# Patient Record
Sex: Male | Born: 1985 | Race: Black or African American | Hispanic: No | Smoking: Current every day smoker
Health system: Southern US, Community
[De-identification: ages and names within clinical notes are randomized; demographics above are authoritative.]

## PROBLEM LIST (undated history)

## (undated) DIAGNOSIS — K219 Gastro-esophageal reflux disease without esophagitis: Secondary | ICD-10-CM

---
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
## 2006-04-13 ENCOUNTER — Emergency Department (HOSPITAL_COMMUNITY): Admission: EM | Admit: 2006-04-13 | Discharge: 2006-04-13 | Payer: Self-pay | Admitting: Emergency Medicine

## 2006-04-20 ENCOUNTER — Emergency Department (HOSPITAL_COMMUNITY): Admission: EM | Admit: 2006-04-20 | Discharge: 2006-04-20 | Payer: Self-pay | Admitting: Emergency Medicine

## 2008-07-26 ENCOUNTER — Emergency Department (HOSPITAL_COMMUNITY): Admission: EM | Admit: 2008-07-26 | Discharge: 2008-07-26 | Payer: Self-pay | Admitting: Family Medicine

## 2009-12-19 ENCOUNTER — Inpatient Hospital Stay (HOSPITAL_COMMUNITY): Admission: EM | Admit: 2009-12-19 | Discharge: 2009-12-22 | Payer: Self-pay | Admitting: Emergency Medicine

## 2009-12-21 ENCOUNTER — Encounter: Payer: Self-pay | Admitting: Internal Medicine

## 2009-12-21 LAB — CONVERTED CEMR LAB
BUN: 9 mg/dL
CO2: 26 meq/L
Calcium: 9.5 mg/dL
Creatinine, Ser: 1.13 mg/dL
HCT: 40.3 %
Platelets: 214 10*3/uL
Potassium: 3.6 meq/L
WBC: 8.2 10*3/uL

## 2009-12-22 ENCOUNTER — Encounter: Payer: Self-pay | Admitting: Internal Medicine

## 2009-12-22 LAB — CONVERTED CEMR LAB
Calcium: 9.3 mg/dL
Chloride: 107 meq/L
Creatinine, Ser: 1.1 mg/dL
Glucose, Bld: 90 mg/dL
HCT: 41.5 %
Hemoglobin: 14 g/dL
Platelets: 228 10*3/uL
Potassium: 3.9 meq/L
RBC: 4.34 M/uL
Sodium: 139 meq/L

## 2009-12-26 ENCOUNTER — Ambulatory Visit: Payer: Self-pay | Admitting: Internal Medicine

## 2009-12-26 DIAGNOSIS — F1910 Other psychoactive substance abuse, uncomplicated: Secondary | ICD-10-CM | POA: Insufficient documentation

## 2009-12-26 DIAGNOSIS — F17200 Nicotine dependence, unspecified, uncomplicated: Secondary | ICD-10-CM

## 2009-12-26 DIAGNOSIS — F1021 Alcohol dependence, in remission: Secondary | ICD-10-CM | POA: Insufficient documentation

## 2009-12-26 DIAGNOSIS — F411 Generalized anxiety disorder: Secondary | ICD-10-CM | POA: Insufficient documentation

## 2011-01-01 LAB — CBC
HCT: 40.3 % (ref 39.0–52.0)
HCT: 41.5 % (ref 39.0–52.0)
Hemoglobin: 12.1 g/dL — ABNORMAL LOW (ref 13.0–17.0)
Hemoglobin: 13.5 g/dL (ref 13.0–17.0)
Hemoglobin: 13.9 g/dL (ref 13.0–17.0)
MCHC: 33.5 g/dL (ref 30.0–36.0)
MCHC: 34.1 g/dL (ref 30.0–36.0)
MCV: 95.7 fL (ref 78.0–100.0)
MCV: 96.5 fL (ref 78.0–100.0)
Platelets: 191 10*3/uL (ref 150–400)
Platelets: 214 10*3/uL (ref 150–400)
Platelets: 228 10*3/uL (ref 150–400)
RBC: 3.71 MIL/uL — ABNORMAL LOW (ref 4.22–5.81)
RBC: 4.34 MIL/uL (ref 4.22–5.81)
RDW: 14.1 % (ref 11.5–15.5)
RDW: 14.1 % (ref 11.5–15.5)
RDW: 14.2 % (ref 11.5–15.5)
RDW: 14.3 % (ref 11.5–15.5)
WBC: 6.3 10*3/uL (ref 4.0–10.5)

## 2011-01-01 LAB — BASIC METABOLIC PANEL
BUN: 10 mg/dL (ref 6–23)
CO2: 28 mEq/L (ref 19–32)
Creatinine, Ser: 1.1 mg/dL (ref 0.4–1.5)
GFR calc Af Amer: >60 mL/min (ref >60)
GFR calc non Af Amer: >60 mL/min (ref >60)
GFR calc non Af Amer: >60 mL/min (ref >60)
Glucose, Bld: 84 mg/dL (ref 70–99)
Sodium: 139 mEq/L (ref 135–145)

## 2011-01-01 LAB — CARDIAC PANEL(CRET KIN+CKTOT+MB+TROPI)
CK, MB: 1.7 ng/mL (ref 0.3–4.0)
Relative Index: 0.2 (ref 0.0–2.5)
Relative Index: 0.2 (ref 0.0–2.5)
Total CK: 644 U/L — ABNORMAL HIGH (ref 7–232)
Total CK: 717 U/L — ABNORMAL HIGH (ref 7–232)
Troponin I: 0.01 ng/mL (ref 0.00–0.06)
Troponin I: 0.03 ng/mL (ref 0.00–0.06)
Troponin I: <0.01 ng/mL (ref 0.00–0.06)

## 2011-01-01 LAB — RAPID URINE DRUG SCREEN, HOSP PERFORMED
Amphetamines: NOT DETECTED
Benzodiazepines: NOT DETECTED
Cocaine: NOT DETECTED
Tetrahydrocannabinol: POSITIVE — AB

## 2011-01-01 LAB — COMPREHENSIVE METABOLIC PANEL
ALT: 18 U/L (ref 0–53)
BUN: 10 mg/dL (ref 6–23)
CO2: 26 mEq/L (ref 19–32)
Chloride: 106 mEq/L (ref 96–112)
Creatinine, Ser: 1 mg/dL (ref 0.4–1.5)
Glucose, Bld: 108 mg/dL — ABNORMAL HIGH (ref 70–99)
Potassium: 3.6 mEq/L (ref 3.5–5.1)
Sodium: 139 mEq/L (ref 135–145)
Total Bilirubin: 0.8 mg/dL (ref 0.3–1.2)

## 2011-01-01 LAB — DIFFERENTIAL
Basophils Absolute: 0 10*3/uL (ref 0.0–0.1)
Basophils Relative: 1 % (ref 0–1)
Eosinophils Absolute: 0 10*3/uL (ref 0.0–0.7)
Lymphocytes Relative: 40 % (ref 12–46)
Monocytes Absolute: 0.6 10*3/uL (ref 0.1–1.0)
Neutrophils Relative %: 52 % (ref 43–77)

## 2011-01-01 LAB — POCT CARDIAC MARKERS: Troponin i, poc: <0.05 ng/mL (ref 0.00–0.09)

## 2011-01-01 LAB — POCT I-STAT, CHEM 8
BUN: 14 mg/dL (ref 6–23)
Chloride: 110 mEq/L (ref 96–112)
Hemoglobin: 15 g/dL (ref 13.0–17.0)

## 2011-01-01 LAB — LIPID PANEL
HDL: 47 mg/dL (ref >39)
LDL Cholesterol: 66 mg/dL (ref 0–99)
Triglycerides: 103 mg/dL (ref <150)

## 2011-01-01 LAB — CK TOTAL AND CKMB (NOT AT ARMC)
Relative Index: 0.2 (ref 0.0–2.5)
Total CK: 826 U/L — ABNORMAL HIGH (ref 7–232)

## 2011-01-01 LAB — TSH: TSH: 0.711 u[IU]/mL (ref 0.350–4.500)

## 2011-01-22 IMAGING — CT CT ANGIO CHEST
2 of 5 series · 19 of 36 positions shown · IV contrast (APPLIED)
Comparison: None.

CLINICAL DATA: Left chest pain, concern for pulmonary embolism

CT ANGIOGRAPHY CHEST WITH CONTRAST
TECHNIQUE: Multidetector CT imaging of the chest was performed
using the standard protocol during bolus administration of
intravenous contrast.  Multiplanar CT image reconstructions
including MIPs were obtained to evaluate the vascular anatomy.
Contrast:  80 ml Omnipaque 300

[Series 11: pulm embolism 1.0 b25f thins · axial · 0.72mm/px · z∈[-163,+78]mm · 16 of 269 slices shown]
[im 14/269  lung]
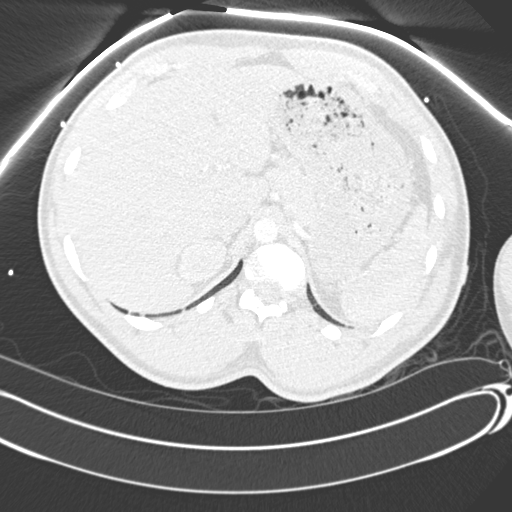
[im 27/269  mediastinal]
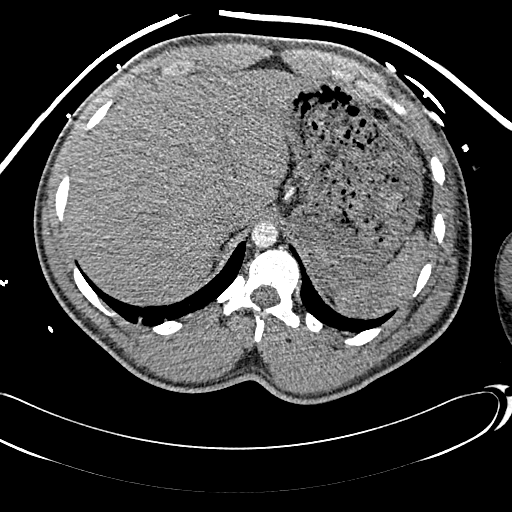
[im 41/269  lung]
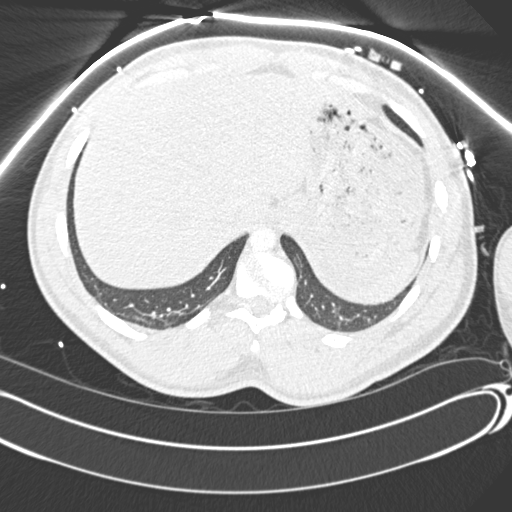
[im 68/269  mediastinal]
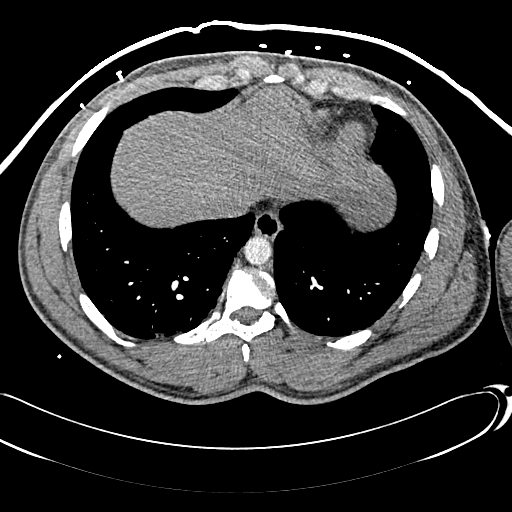
[im 81/269  lung]
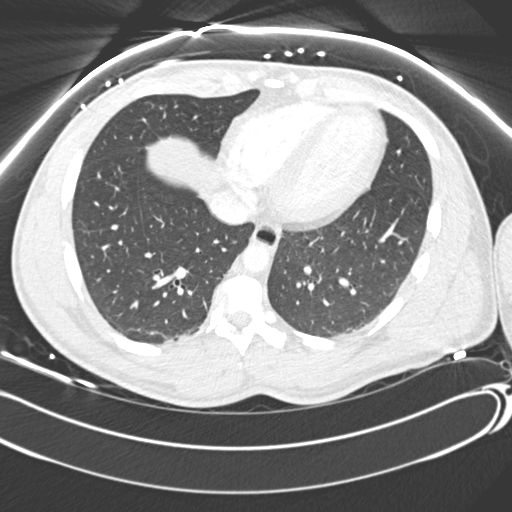
[im 94/269  mediastinal]
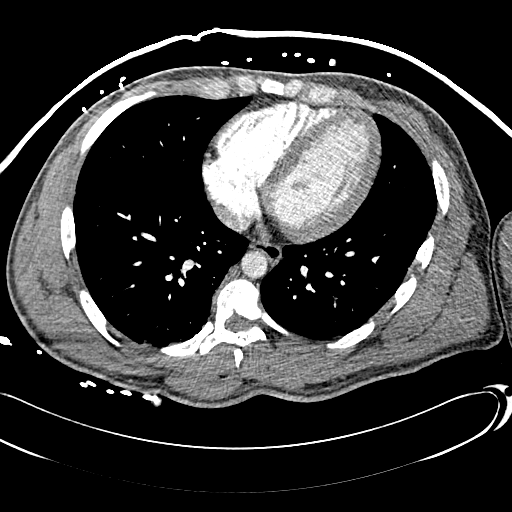
[im 108/269  lung]
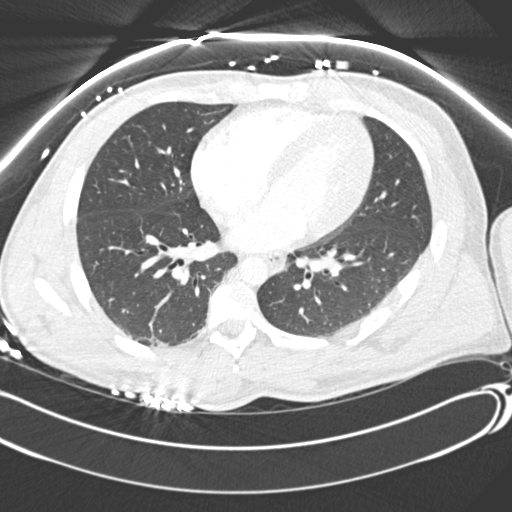
[im 121/269  mediastinal]
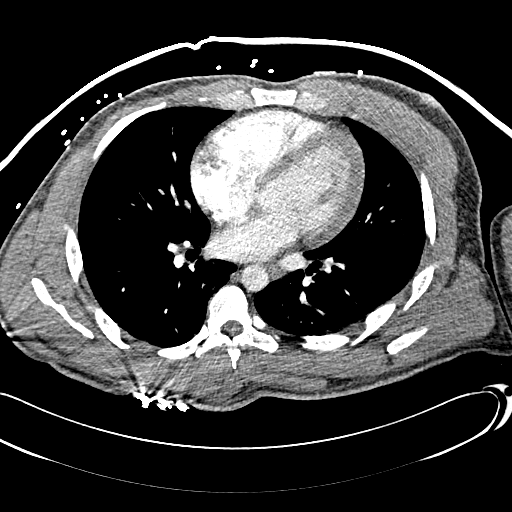
[im 148/269  lung]
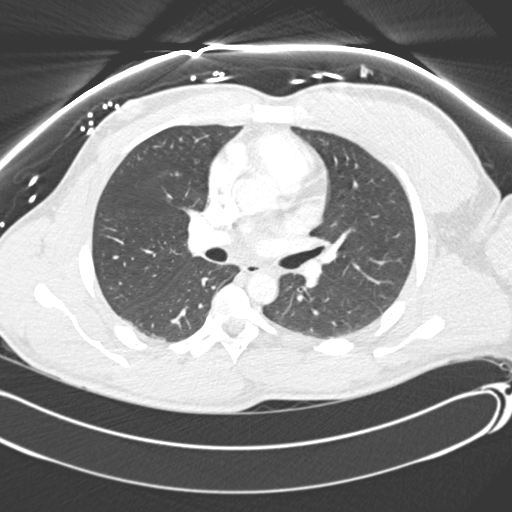
[im 161/269  mediastinal]
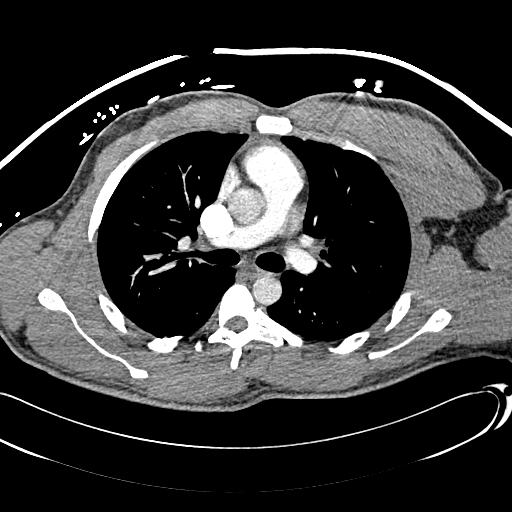
[im 175/269  lung]
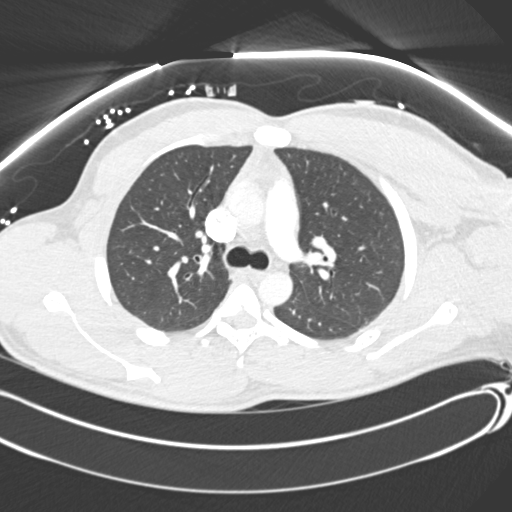
[im 188/269  mediastinal]
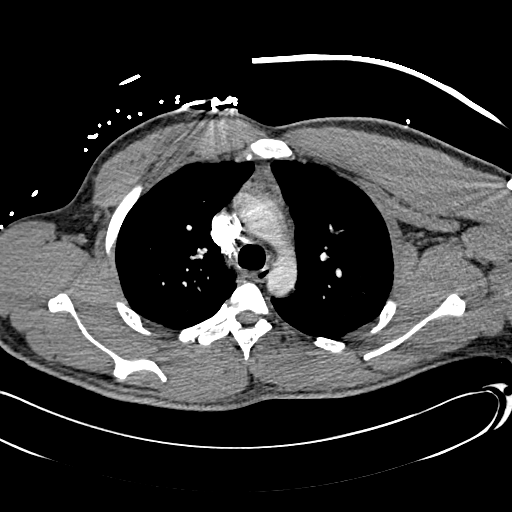
[im 202/269  lung]
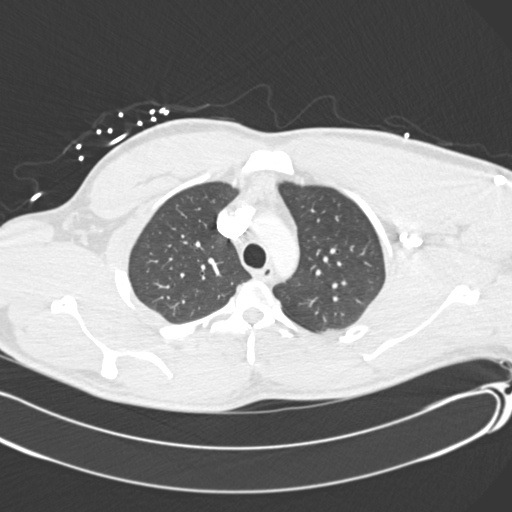
[im 228/269  mediastinal]
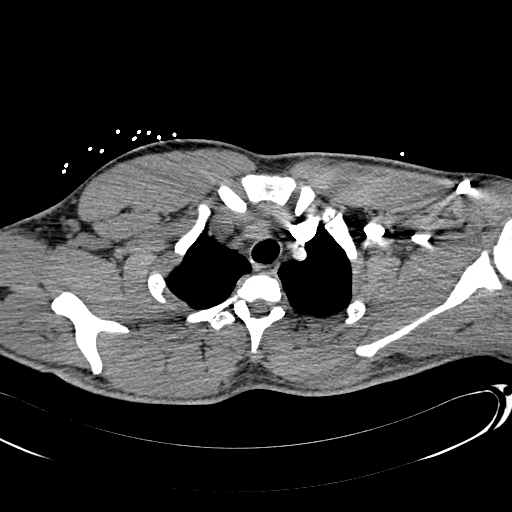
[im 242/269  lung]
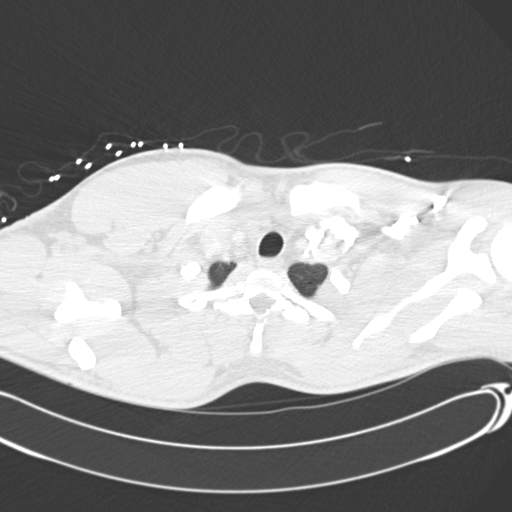
[im 255/269  mediastinal]
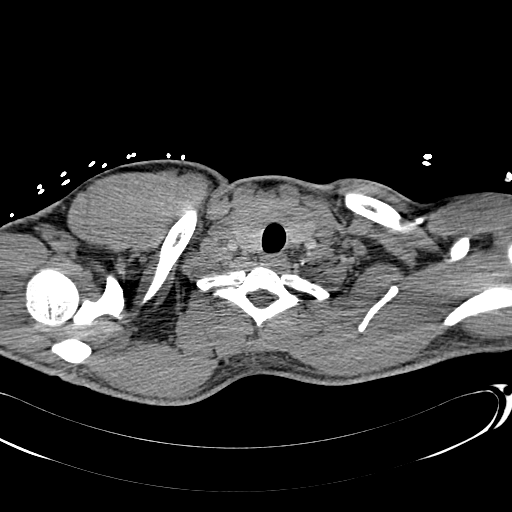

[Series 602: cor · coronal · 0.72mm/px · 3 of 105 slices shown]
[im 21/105  mediastinal]
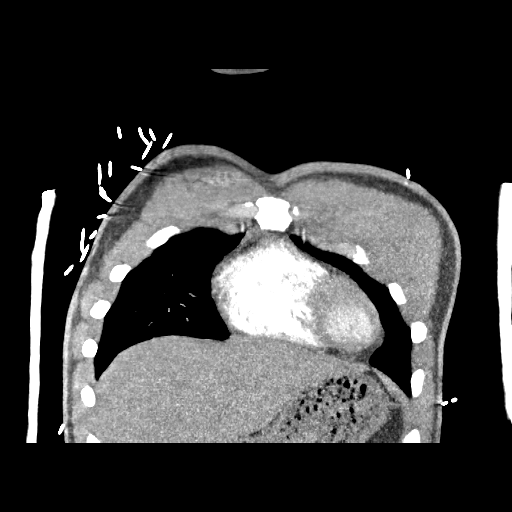
[im 42/105  mediastinal]
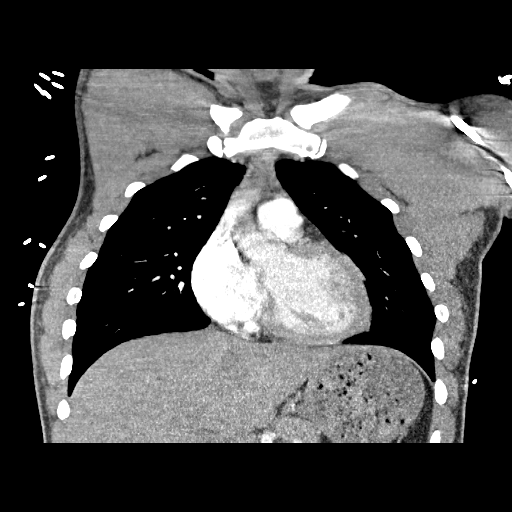
[im 63/105  mediastinal]
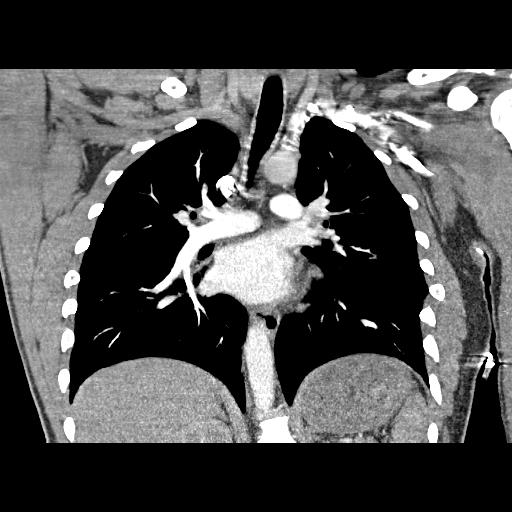

[19 of 36 positions shown; findings below may reference images not displayed]

FINDINGS: There are no filling defects within the pulmonary
arteries to suggest acute pulmonary embolism.  No acute findings of
the aorta or great vessels.  No pericardial effusion.

No chest wall abnormality.  No evidence of axillary or
supraclavicular lymphadenopathy.  No mediastinal or hilar
lymphadenopathy.

Review of the lung parenchyma demonstrates no evidence of pleural
fluid, air space disease, or pneumothorax.  Airways are normal.

Limited view of the upper abdomen is unremarkable.

Limited view of the skeleton demonstrates the sigmoid scoliosis of
the spine.

Review of the MIP images confirms the above findings.
IMPRESSION: 1.  No evidence of pulmonary embolism.
2.  No acute pulmonary parenchymal abnormality.
3.  Scoliosis

## 2011-10-26 ENCOUNTER — Encounter (HOSPITAL_COMMUNITY): Payer: Self-pay | Admitting: Emergency Medicine

## 2011-10-26 ENCOUNTER — Emergency Department (HOSPITAL_COMMUNITY)
Admission: EM | Admit: 2011-10-26 | Discharge: 2011-10-27 | Disposition: A | Discharge status: Home / Self Care | Payer: BC Managed Care – PPO | Attending: Emergency Medicine | Admitting: Emergency Medicine

## 2011-10-26 DIAGNOSIS — R51 Headache: Secondary | ICD-10-CM | POA: Insufficient documentation

## 2011-10-26 DIAGNOSIS — R1110 Vomiting, unspecified: Secondary | ICD-10-CM

## 2011-10-26 DIAGNOSIS — K219 Gastro-esophageal reflux disease without esophagitis: Secondary | ICD-10-CM | POA: Insufficient documentation

## 2011-10-26 DIAGNOSIS — R112 Nausea with vomiting, unspecified: Secondary | ICD-10-CM | POA: Insufficient documentation

## 2011-10-26 HISTORY — DX: Gastro-esophageal reflux disease without esophagitis: K21.9

## 2011-10-26 MED ORDER — ONDANSETRON 8 MG PO TBDP
8.0000 mg | ORAL_TABLET | Freq: Once | ORAL | Status: AC
  Administered 2011-10-27: 8 mg via ORAL

## 2011-10-26 NOTE — ED Notes (Signed)
Pt states this afternoon he started to have a headache that made him nauseated then he started to have vomiting  Pt states as the vomiting progressed he started noticing streaks of blood mixed in with the emesis  Pt states he has had about 4 to 5 episodes of vomiting today

## 2011-10-26 NOTE — ED Notes (Signed)
Pt states with his headache he is sensative to noise and light  Pt states he also has acid reflux which has been bothering him lately  Pt states he also has pain in his left ear

## 2011-10-27 MED ORDER — ONDANSETRON 8 MG PO TBDP: 8.0000 mg | ORAL_TABLET | Freq: Once | ORAL | Status: AC

## 2011-10-27 MED ORDER — PANTOPRAZOLE SODIUM 20 MG PO TBEC: 20.0000 mg | DELAYED_RELEASE_TABLET | Freq: Every day | ORAL | Status: AC

## 2011-10-27 NOTE — ED Provider Notes (Signed)
History     CSN: [id number]  Arrival date & time 10/26/11  2229   First MD Initiated Contact with Patient 10/26/11 2332      Chief Complaint  Patient presents with  . Hematemesis    (Consider location/radiation/quality/duration/timing/severity/associated sxs/prior treatment) HPI Comments: Patient here with a day history of headache with sensitivity to noise and light - no history of migraines - reports nausea with several episodes of vomiting today - the last couple with blood streaks - states that his wife then insisted he come in.  Patient is a 26 y.o. male presenting with headaches. The history is provided by the patient. No language interpreter was used.  Headache  This is a new problem. The current episode started 12 to 24 hours ago. The problem occurs constantly. The problem has been gradually improving. The headache is associated with bright light. The pain is located in the bilateral and frontal region. The quality of the pain is described as throbbing. The pain is at a severity of 3/10. The pain is mild. The pain does not radiate. Associated symptoms include nausea and vomiting. Pertinent negatives include no anorexia, no fever, no malaise/fatigue, no chest pressure, no near-syncope, no orthopnea, no palpitations, no syncope and no shortness of breath. He has tried nothing for the symptoms. The treatment provided no relief.    Past Medical History  Diagnosis Date  . Acid reflux     History reviewed. No pertinent past surgical history.  Family History  Problem Relation Age of Onset  . Diabetes Mother   . Hypertension Mother   . Hypertension Sister   . Diabetes Brother     History  Substance Use Topics  . Smoking status: Current Everyday Smoker -- 0.5 packs/day    Types: Cigarettes  . Smokeless tobacco: Not on file  . Alcohol Use: Yes     occassional      Review of Systems  Constitutional: Negative for fever and malaise/fatigue.  Respiratory: Negative for  shortness of breath.   Cardiovascular: Negative for palpitations, orthopnea, syncope and near-syncope.  Gastrointestinal: Positive for nausea and vomiting. Negative for anorexia.  Neurological: Positive for headaches.  All other systems reviewed and are negative.    Allergies  Ibuprofen  Home Medications   Current Outpatient Rx  Name Route Sig Dispense Refill  . ONDANSETRON 8 MG PO TBDP Oral Take 1 tablet (8 mg total) by mouth once. 20 tablet 0  . PANTOPRAZOLE SODIUM 20 MG PO TBEC Oral Take 1 tablet (20 mg total) by mouth daily. 30 tablet 0    BP 130/91  Pulse 77  Temp(Src) 98.3 F (36.8 C) (Oral)  Resp 18  SpO2 100%  Physical Exam  Nursing note and vitals reviewed. Constitutional: He is oriented to person, place, and time. He appears well-developed and well-nourished. No distress.  HENT:  Head: Normocephalic and atraumatic.  Right Ear: External ear normal.  Left Ear: External ear normal.  Mouth/Throat: Oropharynx is clear and moist. No oropharyngeal exudate.  Eyes: Conjunctivae are normal. Pupils are equal, round, and reactive to light. No scleral icterus.  Neck: Normal range of motion. Neck supple.  Cardiovascular: Normal rate, regular rhythm and normal heart sounds.  Exam reveals no gallop and no friction rub.   No murmur heard. Pulmonary/Chest: Effort normal and breath sounds normal. He exhibits no tenderness.  Abdominal: Soft. Bowel sounds are normal. There is no tenderness.  Musculoskeletal: Normal range of motion.  Lymphadenopathy:    He has no cervical adenopathy.  Neurological: He is alert and oriented to person, place, and time. No cranial nerve deficit.  Skin: Skin is warm and dry.  Psychiatric: He has a normal mood and affect. His behavior is normal. Judgment and thought content normal.    ED Course  Procedures (including critical care time)  Labs Reviewed - No data to display No results found.   1. Vomiting       MDM  Patient does not wish to  have IV fluids or any interventions - states that he now feels better - he would like medication for the nausea - given zofran OTD - patient without any concerning features on physical examiantion - has history of GERD which would likely explain the blood streaks in the vomit - given rx for protonix as well per his request.        Scarlette Calico C. [place], [place] 10/27/11 [phone]

## 2011-10-27 NOTE — ED Notes (Signed)
Pt states that he feels better and is leaving. Explained possible problems with his condition. Pt still states wants to leave.

## 2011-10-27 NOTE — ED Provider Notes (Signed)
Medical screening examination/treatment/procedure(s) were performed by non-physician practitioner and as supervising physician I was immediately available for consultation/collaboration.   Hanley Seamen, MD 10/27/11 [phone]

## 2011-10-27 NOTE — ED Notes (Signed)
PA spoke with pt. Pt will stay for medicationa and then discharge

## 2012-03-06 ENCOUNTER — Ambulatory Visit (INDEPENDENT_AMBULATORY_CARE_PROVIDER_SITE_OTHER): Payer: BC Managed Care – PPO

## 2012-03-07 ENCOUNTER — Ambulatory Visit (INDEPENDENT_AMBULATORY_CARE_PROVIDER_SITE_OTHER): Payer: BC Managed Care – PPO | Admitting: Family Medicine

## 2012-03-07 VITALS — BP 149/103 | HR 79 | Temp 98.6°F | Resp 18 | Ht 67.5 in | Wt 187.0 lb

## 2012-03-07 DIAGNOSIS — N390 Urinary tract infection, site not specified: Secondary | ICD-10-CM

## 2012-03-07 DIAGNOSIS — F1020 Alcohol dependence, uncomplicated: Secondary | ICD-10-CM

## 2012-03-07 LAB — COMPREHENSIVE METABOLIC PANEL
ALT: 27 U/L (ref 0–53)
AST: 22 U/L (ref 0–37)
Calcium: 10.3 mg/dL (ref 8.4–10.5)
Chloride: 106 mEq/L (ref 96–112)
Creat: 1.01 mg/dL (ref 0.50–1.35)
Potassium: 4.2 mEq/L (ref 3.5–5.3)
Sodium: 139 mEq/L (ref 135–145)

## 2012-03-07 LAB — POCT CBC
Granulocyte percent: 65.5 %G (ref 37–80)
MCV: 93.8 fL (ref 80–97)
MID (cbc): 0.4 (ref 0–0.9)
MPV: 8.8 fL (ref 0–99.8)
Platelet Count, POC: 316 10*3/uL (ref 142–424)
RBC: 4.99 M/uL (ref 4.69–6.13)

## 2012-03-07 LAB — POCT URINALYSIS DIPSTICK
Glucose, UA: NEGATIVE
Leukocytes, UA: NEGATIVE
Nitrite, UA: NEGATIVE
Urobilinogen, UA: 0.2

## 2012-03-07 LAB — POCT UA - MICROSCOPIC ONLY: Crystals, Ur, HPF, POC: NEGATIVE

## 2012-03-07 MED ORDER — DOXYCYCLINE HYCLATE 100 MG PO TABS: 100.0000 mg | ORAL_TABLET | Freq: Two times a day (BID) | ORAL | Status: AC

## 2012-03-07 NOTE — Progress Notes (Signed)
Subjective:    Patient ID: Andre Wood, male    DOB: Jul 19, 1986, 26 y.o.   MRN: [id number]  HPI  Patient presents complaining of pain at the end of his urine stream X 6 weeks.  No penile discharge or penile lesion Last night (L) testicular tenderness Urinary hesitancy; incomplete emptying; dribbling  Married; monogomous  Etoh- 2/5 of liquor on the weekend [facility]- one once weekly   Review of Systems     Objective:   Physical Exam  Constitutional: He appears well-developed.  HENT:  Head: Normocephalic and atraumatic.  Mouth/Throat: Oropharynx is clear and moist.  Eyes: EOM are normal.  Neck: Neck supple. No thyromegaly present.  Cardiovascular: Normal rate, regular rhythm and normal heart sounds.   Pulmonary/Chest: Effort normal and breath sounds normal.  Abdominal: Soft. Bowel sounds are normal. There is no tenderness.  Genitourinary: Testes normal and penis normal. Right testis shows no tenderness. Left testis shows no tenderness. No penile erythema. No discharge found.       Declined prostate examination  Neurological: He is alert.       Results for orders placed in visit on 03/07/12  POCT URINALYSIS DIPSTICK      Component Value Range   Color, UA YELLOW     Clarity, UA CLEAR     Glucose, UA NEG     Bilirubin, UA SMALL     Ketones, UA 15     Spec Grav, UA >=1.030     Blood, UA NEG     pH, UA 5.5     Protein, UA TRACE     Urobilinogen, UA 0.2     Nitrite, UA NEG     Leukocytes, UA Negative    POCT UA - MICROSCOPIC ONLY      Component Value Range   WBC, Ur, HPF, POC 0-1     RBC, urine, microscopic 0-1     Bacteria, U Microscopic TRACE     Mucus, UA POSITIVE     Epithelial cells, urine per micros NEG     Crystals, Ur, HPF, POC NEG     Casts, Ur, LPF, POC NEG     Yeast, UA NEG    POCT CBC      Component Value Range   WBC 6.8  4.6 - 10.2 (K/uL)   Lymph, poc 2.0  0.6 - 3.4    POC LYMPH PERCENT 28.9  10 - 50 (%L)   MID (cbc) 0.4  0 - 0.9    POC MID %  5.6  0 - 12 (%M)   POC Granulocyte 4.5  2 - 6.9    Granulocyte percent 65.5  37 - 80 (%G)   RBC 4.99  4.69 - 6.13 (M/uL)   Hemoglobin 14.9  14.1 - 18.1 (g/dL)   HCT, POC 40.9  81.1 - 53.7 (%)   MCV 93.8  80 - 97 (fL)   MCH, POC 29.9  27 - 31.2 (pg)   MCHC 31.8  31.8 - 35.4 (g/dL)   RDW, POC 91.4     Platelet Count, POC 316  142 - 424 (K/uL)   MPV 8.8  0 - 99.8 (fL)       Assessment & Plan:   1. Dysuria; suspect prostatitis given clinical presentation. POCT urinalysis dipstick, POCT UA - Microscopic Only, GC/chlamydia probe amp, urine, doxycycline (VIBRA-TABS) 100 MG tablet  2. 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

## 2012-03-08 LAB — GC/CHLAMYDIA PROBE AMP, URINE
Chlamydia, Swab/Urine, PCR: NEGATIVE
GC Probe Amp, Urine: NEGATIVE

## 2012-03-10 ENCOUNTER — Encounter: Payer: Self-pay | Admitting: *Deleted

## 2012-05-30 ENCOUNTER — Other Ambulatory Visit (HOSPITAL_COMMUNITY): Payer: Self-pay | Admitting: Chiropractic Medicine

## 2012-05-30 DIAGNOSIS — IMO0002 Reserved for concepts with insufficient information to code with codable children: Secondary | ICD-10-CM

## 2012-06-02 ENCOUNTER — Inpatient Hospital Stay (HOSPITAL_COMMUNITY)
Admission: RE | Admit: 2012-06-02 | Discharge: 2012-06-02 | Payer: BC Managed Care – PPO | Source: Ambulatory Visit | Attending: Chiropractic Medicine | Admitting: Chiropractic Medicine

## 2015-08-31 ENCOUNTER — Emergency Department (HOSPITAL_COMMUNITY)
Admission: EM | Admit: 2015-08-31 | Discharge: 2015-08-31 | Disposition: A | Discharge status: Home / Self Care | Payer: Medicaid Other | Attending: Emergency Medicine | Admitting: Emergency Medicine

## 2015-08-31 DIAGNOSIS — K219 Gastro-esophageal reflux disease without esophagitis: Secondary | ICD-10-CM | POA: Diagnosis not present

## 2015-08-31 DIAGNOSIS — F17210 Nicotine dependence, cigarettes, uncomplicated: Secondary | ICD-10-CM | POA: Diagnosis not present

## 2015-08-31 DIAGNOSIS — Z79899 Other long term (current) drug therapy: Secondary | ICD-10-CM | POA: Insufficient documentation

## 2015-08-31 DIAGNOSIS — R369 Urethral discharge, unspecified: Secondary | ICD-10-CM | POA: Diagnosis present

## 2015-08-31 MED ORDER — CEFTRIAXONE SODIUM 250 MG IJ SOLR
250.0000 mg | Freq: Once | INTRAMUSCULAR | Status: AC
  Administered 2015-08-31: 250 mg via INTRAMUSCULAR
  Filled 2015-08-31: qty 250

## 2015-08-31 MED ORDER — LIDOCAINE HCL (PF) 1 % IJ SOLN
INTRAMUSCULAR | Status: AC
  Filled 2015-08-31: qty 5

## 2015-08-31 MED ORDER — AZITHROMYCIN 250 MG PO TABS
1000.0000 mg | ORAL_TABLET | Freq: Once | ORAL | Status: AC
  Administered 2015-08-31: 1000 mg via ORAL
  Filled 2015-08-31: qty 4

## 2015-08-31 NOTE — ED Provider Notes (Signed)
CSN: [id number]     Arrival date & time 08/31/15  1716 History  By signing my name below, I, Andre Wood, attest that this documentation has been prepared under the direction and in the presence of Vesta Wheeland, PA-C. Electronically Signed: Lyndel Wood, ED Scribe. 08/31/2015. 5:37 PM.   Chief Complaint  Patient presents with  . Penile Discharge   The history is provided by the patient. No language interpreter was used.   HPI Comments: Breslin Hemann is a 29 y.o. male, with no pertinent PMHx, who presents to the Emergency Department complaining of sudden onset white discharge from his penis that he noticed this morning s/p possible STD exposure last night. Pt reports while having sexual intercourse last night the condom broke and he is concerned for STD exposure. Pt associates mild tenderness to distal aspect of penis over the glans. He denies abdominal pain, nausea, vomiting, painful bowel movements, dysuria or any other urinary symptoms, tenderness or swelling to testicles, and inguinal LAD. Pt afebrile.   Past Medical History  Diagnosis Date  . Acid reflux    No past surgical history on file. Family History  Problem Relation Age of Onset  . Diabetes Mother   . Hypertension Mother   . Hypertension Sister   . Diabetes Brother    Social History  Substance Use Topics  . Smoking status: Current Every Day Smoker -- 0.50 packs/day    Types: Cigarettes  . Smokeless tobacco: Not on file  . Alcohol Use: Yes     Comment: occassional    Review of Systems  Constitutional: Negative for fever.  Genitourinary: Positive for discharge and penile pain. Negative for dysuria, urgency, frequency, hematuria, decreased urine volume, penile swelling, scrotal swelling, difficulty urinating, genital sores and testicular pain.  All other systems reviewed and are negative.  Allergies  Ibuprofen  Home Medications   Prior to Admission medications   Medication Sig Start Date End Date  Taking? Authorizing Provider  pantoprazole (PROTONIX) 20 MG tablet Take 1 tablet (20 mg total) by mouth daily. 10/27/11 10/26/12  Cherrie Distance, PA-C   BP 162/109 mmHg  Pulse 120  Temp(Src) 99.1 F (37.3 C) (Oral)  Resp 20  SpO2 100% Physical Exam  Constitutional: He appears well-developed and well-nourished. No distress.  HENT:  Head: Normocephalic and atraumatic.  Right Ear: External ear normal.  Left Ear: External ear normal.  Eyes: Conjunctivae are normal. Right eye exhibits no discharge. Left eye exhibits no discharge. No scleral icterus.  Neck: Normal range of motion.  Cardiovascular: Normal rate.   Pulmonary/Chest: Effort normal.  Abdominal: Soft. He exhibits no distension. There is no tenderness. There is no rebound and no guarding.  Genitourinary: Testes normal. Cremasteric reflex is present. Right testis shows no mass, no swelling and no tenderness. Left testis shows no mass, no swelling and no tenderness. Circumcised. Penile tenderness present. No penile erythema. Discharge found.  Small amount of white discharge noted at urethra. Mild tenderness of glans of the penis. No erythema or of the penis. No testicular tenderness or scrotal swelling. No inguinal adenopathy. No rashes or lesions present.  Musculoskeletal: Normal range of motion.  Moves all extremities spontaneously  Lymphadenopathy:       Right: No inguinal adenopathy present.       Left: No inguinal adenopathy present.  Neurological: He is alert. Coordination normal.  Skin: Skin is warm and dry.  Psychiatric: He has a normal mood and affect. His behavior is normal.  Nursing note and vitals reviewed.  ED Course  Procedures  DIAGNOSTIC STUDIES: Oxygen Saturation is 100% on RA, normal by my interpretation.    COORDINATION OF CARE: 5:35 PM Discussed treatment plan with pt at bedside which includes to obtain penile swab and order urinalysis and diagnostic labs. Pt agreed to plan.   Labs Review Labs Reviewed   GC/CHLAMYDIA PROBE AMP ([place]) NOT AT [facility]   I have personally reviewed and evaluated these lab results as part of my medical decision-making.  MDM   Final diagnoses:  Penile discharge   Patient is afebrile without abdominal tenderness, abdominal pain or painful bowel movements to indicate prostatitis.  No tenderness to palpation of the testes or epididymis to suggest orchitis or epididymitis.  STD cultures obtained including gonorrhea and chlamydia. Patient to be discharged with instructions to follow up with PCP. Discussed importance of using protection when sexually active. Pt understands that they have GC/Chlamydia cultures pending and that they will need to inform all sexual partners if results return positive. Patient has been treated prophylactically with azithromycin and Rocephin. Return precautions given in discharge paperwork and discussed with pt at bedside. Pt stable for discharge  I personally performed the services described in this documentation, which was scribed in my presence. The recorded information has been reviewed and is accurate.   Alveta HeimlichStevi Bjorn Hallas, PA-C 08/31/15 1757  Pricilla LovelessScott Goldston, MD 08/31/15 2342

## 2015-08-31 NOTE — ED Notes (Signed)
Pt reports that he woke up this AM and had penile discharge.  See PA note

## 2015-08-31 NOTE — ED Notes (Signed)
See PA note.

## 2015-08-31 NOTE — Discharge Instructions (Signed)
You will receive a call if your gonorrhea or chlamydia test is positive. Return to the emergency department with worsening pain, testicular pain, testicular swelling or any other new or concerning symptoms. Continue to use condoms for protection. Abstain from sexual intercourse until your test results have come back.

## 2015-09-02 LAB — GC/CHLAMYDIA PROBE AMP (~~LOC~~) NOT AT ARMC
CHLAMYDIA, DNA PROBE: NEGATIVE
NEISSERIA GONORRHEA: NEGATIVE

## 2019-12-24 ENCOUNTER — Ambulatory Visit: Payer: Self-pay | Attending: Internal Medicine

## 2019-12-31 ENCOUNTER — Ambulatory Visit: Payer: Self-pay | Attending: Family

## 2019-12-31 DIAGNOSIS — Z23 Encounter for immunization: Secondary | ICD-10-CM

## 2019-12-31 NOTE — Progress Notes (Signed)
   Covid-19 Vaccination Clinic  Name:  Tashon Capp    MRN: [id number] DOB: 1986-02-26  12/31/2019  Mr. Edman was observed post Covid-19 immunization for 15 minutes without incident. He was provided with Vaccine Information Sheet and instruction to access the V-Safe system.   Mr. Zingaro was instructed to call 911 with any severe reactions post vaccine: Marland Kitchen Difficulty breathing  . Swelling of face and throat  . A fast heartbeat  . A bad rash all over body  . Dizziness and weakness   Immunizations Administered    Name Date Dose VIS Date Route   Moderna COVID-19 Vaccine 12/31/2019  4:46 PM 0.5 mL 09/08/2019 Intramuscular   Manufacturer: Moderna   Lot: [id number]   NDC: 94174-081-44

## 2020-02-02 ENCOUNTER — Ambulatory Visit: Payer: Self-pay | Attending: Family

## 2020-02-02 DIAGNOSIS — Z23 Encounter for immunization: Secondary | ICD-10-CM

## 2020-02-02 NOTE — Progress Notes (Signed)
   Covid-19 Vaccination Clinic  Name:  Andre Wood    MRN: [id number] DOB: 07/11/1986  02/02/2020  [name] was observed post Covid-19 immunization for 15 minutes without incident. He was provided with Vaccine Information Sheet and instruction to access the V-Safe system.   [name] was instructed to call 911 with any severe reactions post vaccine: Marland Kitchen Difficulty breathing  . Swelling of face and throat  . A fast heartbeat  . A bad rash all over body  . Dizziness and weakness   Immunizations Administered    Name Date Dose VIS Date Route   Moderna COVID-19 Vaccine 02/02/2020 10:32 AM 0.5 mL 09/2019 Intramuscular   Manufacturer: Moderna   Lot: [id number]   NDC: 95396-728-97

## 2020-05-10 ENCOUNTER — Other Ambulatory Visit: Payer: Self-pay

## 2020-05-10 ENCOUNTER — Other Ambulatory Visit: Payer: Self-pay | Admitting: Critical Care Medicine

## 2020-05-10 DIAGNOSIS — Z20822 Contact with and (suspected) exposure to covid-19: Secondary | ICD-10-CM

## 2020-05-11 LAB — SARS-COV-2, NAA 2 DAY TAT

## 2020-05-11 LAB — NOVEL CORONAVIRUS, NAA: SARS-CoV-2, NAA: NOT DETECTED

## 2020-10-11 ENCOUNTER — Other Ambulatory Visit: Payer: Self-pay

## 2020-10-11 DIAGNOSIS — Z20822 Contact with and (suspected) exposure to covid-19: Secondary | ICD-10-CM

## 2020-10-13 LAB — SARS-COV-2, NAA 2 DAY TAT

## 2020-10-13 LAB — NOVEL CORONAVIRUS, NAA: SARS-CoV-2, NAA: NOT DETECTED

## 2022-12-13 ENCOUNTER — Ambulatory Visit: Admission: EM | Admit: 2022-12-13 | Discharge: 2022-12-13 | Payer: Commercial Managed Care - HMO

## 2022-12-13 DIAGNOSIS — J069 Acute upper respiratory infection, unspecified: Secondary | ICD-10-CM | POA: Diagnosis not present

## 2022-12-13 DIAGNOSIS — R55 Syncope and collapse: Secondary | ICD-10-CM

## 2022-12-13 NOTE — ED Notes (Signed)
Patient is being discharged from the Urgent Care and sent to the Emergency Department via personal vehicle with spouse . Per Provider [facility], patient is in need of higher level of care due to syncope. Patient is aware and verbalizes understanding of plan of care.  Vitals:   12/13/22 1608  BP: (!) 171/127  Pulse: (!) 103  Resp: 18  Temp: 97.9 F (36.6 C)  SpO2: 97%

## 2022-12-13 NOTE — ED Triage Notes (Signed)
Pt presents with non productive cough X 5 days; pt also complains of LOC today.

## 2022-12-13 NOTE — ED Provider Notes (Signed)
[facility]    CSN: [id number] Arrival date & time: 12/13/22  1552      History   Chief Complaint Chief Complaint  Patient presents with   Cough   Loss of Consciousness    HPI Andre Wood is a 37 y.o. male.   Patient presents with cough, nasal congestion, and syncopal episode.  Patient reports that cough and congestion has been present for about 7 days.  He denies any known fevers or sick contacts.  Patient reports that he had a syncopal episode today about 2 hours prior to arrival.  It was witnessed by his girlfriend and children.  He reports that he was walking out of the room to talk to his family when he passed out.  He is not sure how long he was out.  Denies hitting his head. Denies that he takes any blood thinning medications.  Reports that he has been drinking adequate fluids.  Denies chest pain or shortness of breath.    Cough Loss of Consciousness   Past Medical History:  Diagnosis Date   Acid reflux     Patient Active Problem List   Diagnosis Date Noted   ANXIETY 12/26/2009   TOBACCO ABUSE 12/26/2009   SUBSTANCE ABUSE, MULTIPLE 12/26/2009   ALCOHOL ABUSE, HX OF 12/26/2009    History reviewed. No pertinent surgical history.     Home Medications    Prior to Admission medications   Medication Sig Start Date End Date Taking? Authorizing Provider  pantoprazole (PROTONIX) 20 MG tablet Take 1 tablet (20 mg total) by mouth daily. 10/27/11 10/26/12  Ignacia Felling, PA-C    Family History Family History  Problem Relation Age of Onset   Diabetes Mother    Hypertension Mother    Hypertension Sister    Diabetes Brother     Social History Social History   Tobacco Use   Smoking status: Every Day    Packs/day: 0.50    Types: Cigarettes  Substance Use Topics   Alcohol use: Yes    Comment: occassional   Drug use: Yes    Types: Marijuana     Allergies   Ibuprofen   Review of Systems Review of Systems  Cardiovascular:   Positive for syncope.  Per HPI   Physical Exam Triage Vital Signs ED Triage Vitals  Enc Vitals Group     BP 12/13/22 1608 (!) 171/127     Pulse Rate 12/13/22 1608 (!) 103     Resp 12/13/22 1608 18     Temp 12/13/22 1608 97.9 F (36.6 C)     Temp Source 12/13/22 1608 Oral     SpO2 12/13/22 1608 97 %     Weight --      Height --      Head Circumference --      Peak Flow --      Pain Score 12/13/22 1607 0     Pain Loc --      Pain Edu? --      Excl. in [place]? --    No data found.  Updated Vital Signs BP (!) 171/127 (BP Location: Left Arm)   Pulse (!) 103   Temp 97.9 F (36.6 C) (Oral)   Resp 18   SpO2 97%   Visual Acuity Right Eye Distance:   Left Eye Distance:   Bilateral Distance:    Right Eye Near:   Left Eye Near:    Bilateral Near:     Physical Exam Constitutional:  General: He is not in acute distress.    Appearance: Normal appearance. He is not toxic-appearing or diaphoretic.  HENT:     Head: Normocephalic and atraumatic.     Nose: Congestion present.     Mouth/Throat:     Mouth: Mucous membranes are moist.     Pharynx: No posterior oropharyngeal erythema.  Eyes:     Extraocular Movements: Extraocular movements intact.     Conjunctiva/sclera: Conjunctivae normal.     Pupils: Pupils are equal, round, and reactive to light.  Cardiovascular:     Rate and Rhythm: Regular rhythm. Tachycardia present.     Pulses: Normal pulses.     Heart sounds: Normal heart sounds.  Pulmonary:     Effort: Pulmonary effort is normal. No respiratory distress.     Breath sounds: Normal breath sounds. No wheezing.  Abdominal:     General: Abdomen is flat. Bowel sounds are normal.     Palpations: Abdomen is soft.  Musculoskeletal:        General: Normal range of motion.     Cervical back: Normal range of motion.  Skin:    General: Skin is warm and dry.  Neurological:     General: No focal deficit present.     Mental Status: He is alert and oriented to person,  place, and time. Mental status is at baseline.  Psychiatric:        Mood and Affect: Mood normal.        Behavior: Behavior normal.      UC Treatments / Results  Labs (all labs ordered are listed, but only abnormal results are displayed) Labs Reviewed - No data to display  EKG   Radiology No results found.  Procedures Procedures (including critical care time)  Medications Ordered in UC Medications - No data to display  Initial Impression / Assessment and Plan / UC Course  I have reviewed the triage vital signs and the nursing notes.  Pertinent labs & imaging results that were available during my care of the patient were reviewed by me and considered in my medical decision making (see chart for details).     Given recent syncopal episode, elevated blood pressure reading, tachycardia in the setting of acute illness I do think this requires more extensive evaluation than can be provided here at urgent care.  Patient was advised to go to the ER for further evaluation and management.  He was agreeable with plan.  Patient stable at discharge.  Agree with patient's family transporting him to the ER. Final Clinical Impressions(s) / UC Diagnoses   Final diagnoses:  Syncope, unspecified syncope type  Viral upper respiratory tract infection with cough     Discharge Instructions      Please go straight to the emergency department as soon as you leave urgent care for further evaluation and management.    ED Prescriptions   None    PDMP not reviewed this encounter.   Teodora Medici, [place] 12/13/22 [phone]

## 2022-12-13 NOTE — Discharge Instructions (Signed)
Please go straight to the emergency department as soon as you leave urgent care for further evaluation and management.

## 2022-12-14 ENCOUNTER — Emergency Department (HOSPITAL_BASED_OUTPATIENT_CLINIC_OR_DEPARTMENT_OTHER)
Admission: EM | Admit: 2022-12-14 | Discharge: 2022-12-14 | Discharge status: Against Medical Advice | Payer: Commercial Managed Care - HMO | Attending: Emergency Medicine | Admitting: Emergency Medicine

## 2022-12-14 ENCOUNTER — Emergency Department (HOSPITAL_BASED_OUTPATIENT_CLINIC_OR_DEPARTMENT_OTHER): Payer: Commercial Managed Care - HMO | Admitting: Radiology

## 2022-12-14 ENCOUNTER — Other Ambulatory Visit: Payer: Self-pay

## 2022-12-14 ENCOUNTER — Emergency Department (HOSPITAL_BASED_OUTPATIENT_CLINIC_OR_DEPARTMENT_OTHER)
Admission: EM | Admit: 2022-12-14 | Discharge: 2022-12-14 | Disposition: A | Discharge status: Home / Self Care | Payer: Commercial Managed Care - HMO | Attending: Emergency Medicine | Admitting: Emergency Medicine

## 2022-12-14 ENCOUNTER — Encounter (HOSPITAL_BASED_OUTPATIENT_CLINIC_OR_DEPARTMENT_OTHER): Payer: Self-pay | Admitting: Emergency Medicine

## 2022-12-14 ENCOUNTER — Encounter (HOSPITAL_BASED_OUTPATIENT_CLINIC_OR_DEPARTMENT_OTHER): Payer: Self-pay

## 2022-12-14 DIAGNOSIS — R0602 Shortness of breath: Secondary | ICD-10-CM | POA: Diagnosis not present

## 2022-12-14 DIAGNOSIS — Z79899 Other long term (current) drug therapy: Secondary | ICD-10-CM | POA: Diagnosis not present

## 2022-12-14 DIAGNOSIS — I10 Essential (primary) hypertension: Secondary | ICD-10-CM | POA: Insufficient documentation

## 2022-12-14 DIAGNOSIS — Z1152 Encounter for screening for COVID-19: Secondary | ICD-10-CM | POA: Insufficient documentation

## 2022-12-14 DIAGNOSIS — F419 Anxiety disorder, unspecified: Secondary | ICD-10-CM | POA: Insufficient documentation

## 2022-12-14 DIAGNOSIS — R0789 Other chest pain: Secondary | ICD-10-CM | POA: Diagnosis not present

## 2022-12-14 DIAGNOSIS — R42 Dizziness and giddiness: Secondary | ICD-10-CM | POA: Insufficient documentation

## 2022-12-14 DIAGNOSIS — R112 Nausea with vomiting, unspecified: Secondary | ICD-10-CM | POA: Diagnosis not present

## 2022-12-14 DIAGNOSIS — R030 Elevated blood-pressure reading, without diagnosis of hypertension: Secondary | ICD-10-CM | POA: Diagnosis present

## 2022-12-14 DIAGNOSIS — J111 Influenza due to unidentified influenza virus with other respiratory manifestations: Secondary | ICD-10-CM

## 2022-12-14 DIAGNOSIS — Z5321 Procedure and treatment not carried out due to patient leaving prior to being seen by health care provider: Secondary | ICD-10-CM | POA: Insufficient documentation

## 2022-12-14 LAB — CBC
HCT: 41.6 % (ref 39.0–52.0)
HCT: 41.4 % (ref 39.0–52.0)
Hemoglobin: 14.1 g/dL (ref 13.0–17.0)
Hemoglobin: 14.5 g/dL (ref 13.0–17.0)
MCH: 31.4 pg (ref 26.0–34.0)
MCH: 32.4 pg (ref 26.0–34.0)
MCHC: 33.9 g/dL (ref 30.0–36.0)
MCHC: 35 g/dL (ref 30.0–36.0)
MCV: 92.7 fL (ref 80.0–100.0)
MCV: 92.4 fL (ref 80.0–100.0)
Platelets: 260 10*3/uL (ref 150–400)
Platelets: 278 10*3/uL (ref 150–400)
RBC: 4.49 MIL/uL (ref 4.22–5.81)
RBC: 4.48 MIL/uL (ref 4.22–5.81)
RDW: 14.4 % (ref 11.5–15.5)
RDW: 14.4 % (ref 11.5–15.5)
WBC: 6.7 10*3/uL (ref 4.0–10.5)
WBC: 7.8 10*3/uL (ref 4.0–10.5)
nRBC: 0 % (ref 0.0–0.2)
nRBC: 0 % (ref 0.0–0.2)

## 2022-12-14 LAB — RESP PANEL BY RT-PCR (RSV, FLU A&B, COVID)  RVPGX2
Influenza A by PCR: NEGATIVE
Influenza B by PCR: NEGATIVE
Resp Syncytial Virus by PCR: NEGATIVE
SARS Coronavirus 2 by RT PCR: NEGATIVE

## 2022-12-14 LAB — BASIC METABOLIC PANEL
Anion gap: 15 (ref 5–15)
Anion gap: 19 — ABNORMAL HIGH (ref 5–15)
BUN: 11 mg/dL (ref 6–20)
BUN: 11 mg/dL (ref 6–20)
CO2: 20 mmol/L — ABNORMAL LOW (ref 22–32)
CO2: 21 mmol/L — ABNORMAL LOW (ref 22–32)
Calcium: 9.8 mg/dL (ref 8.9–10.3)
Calcium: 10.1 mg/dL (ref 8.9–10.3)
Chloride: 103 mmol/L (ref 98–111)
Chloride: 100 mmol/L (ref 98–111)
Creatinine, Ser: 0.83 mg/dL (ref 0.61–1.24)
Creatinine, Ser: 0.92 mg/dL (ref 0.61–1.24)
GFR, Estimated: >60 mL/min (ref >60)
GFR, Estimated: >60 mL/min (ref >60)
Glucose, Bld: 83 mg/dL (ref 70–99)
Glucose, Bld: 87 mg/dL (ref 70–99)
Potassium: 3.5 mmol/L (ref 3.5–5.1)
Potassium: 3.3 mmol/L — ABNORMAL LOW (ref 3.5–5.1)
Sodium: 138 mmol/L (ref 135–145)
Sodium: 140 mmol/L (ref 135–145)

## 2022-12-14 LAB — TROPONIN I (HIGH SENSITIVITY)
Troponin I (High Sensitivity): 10 ng/L (ref <18)
Troponin I (High Sensitivity): 11 ng/L (ref <18)

## 2022-12-14 MED ORDER — AMLODIPINE BESYLATE 5 MG PO TABS: 5.0000 mg | ORAL_TABLET | Freq: Every day | ORAL | 2 refills | Status: AC

## 2022-12-14 MED ORDER — ONDANSETRON 4 MG PO TBDP: 4.0000 mg | ORAL_TABLET | Freq: Three times a day (TID) | ORAL | 0 refills | Status: AC | PRN

## 2022-12-14 MED ORDER — SODIUM CHLORIDE 0.9 % IV BOLUS
1000.0000 mL | Freq: Once | INTRAVENOUS | Status: AC
  Administered 2022-12-14: 1000 mL via INTRAVENOUS

## 2022-12-14 MED ORDER — SODIUM CHLORIDE 0.9 % IV SOLN: INTRAVENOUS | Status: DC

## 2022-12-14 NOTE — ED Notes (Signed)
Patient transported to X-ray 

## 2022-12-14 NOTE — ED Triage Notes (Signed)
Patient here POV from Home.  Endorses Anxiety, Facial and Bilateral Hand Numbness, HTN that began yesterday. Some associated N/V. Also notes SOB and CP.   No Diarrhea. No Fever. BP was 204/153 shortly PTA.   Anxious during Triage. A&Ox4. GCS 15. Ambulatory.

## 2022-12-14 NOTE — Discharge Instructions (Addendum)
Start the amlodipine for your high blood pressure.  Respiratory panel negative for COVID flu RSV.  Workup for the chest pain without any acute findings.  Chest x-ray negative.  Very important to make an appointment for follow-up for better blood pressure control.  Referral information provided the wellness clinic as a starting point for you.  Take the Zofran ODT as needed for nausea vomiting.

## 2022-12-14 NOTE — ED Notes (Signed)
Pt stated he was leaving due to increased anxiety. This RN informed pt that the doctor would be in to see him soon, pt continuing to voice frustration. Pt ambulatory upon departure, NAD noted, refused VS

## 2022-12-14 NOTE — ED Triage Notes (Addendum)
Pt in with anxiety and hypertension. States he is currently going through a lot of stress, and smoked a lot of weed and consumed a lot of ETOH tonight. States he passed out tonight, thinks it was related to his recently diagnosed URI (passed out tonight prior to consumption of ETOH and weed). Denies any injuries from syncopal episode. Reporting chest tightness and lightheadedness

## 2022-12-14 NOTE — ED Provider Notes (Signed)
[facility] Provider Note   CSN: [id number] Arrival date & time: 12/14/22  1417     History  Chief Complaint  Patient presents with   Hypertension    Andre Wood is a 37 y.o. male.  Patient with a 7-day history of upper respiratory flulike illness.  Patient blood pressure also markedly elevated here today.  Patient was seen at urgent care yesterday was noted to be hypertensive had a syncopal episode.  Was referred in for evaluation to the ED.  Patient came here last evening and waited too long and left and now back today.  Patient not known to have a history of hypertension.  Patient also with complaint of some nausea vomiting shortness of breath and some intermittent chest pain.  No diarrhea.  Patient is an everyday smoker.  Past medical history significant for acid reflux.  Patient currently does not have a primary care doctor.  Patient did have some vomiting upon arrival here.  Patient has not had any syncope today.  Stated there was some numbness yesterday.  Denies any weakness or numbness today.  No severe headache.       Home Medications Prior to Admission medications   Medication Sig Start Date End Date Taking? Authorizing Provider  amLODipine (NORVASC) 5 MG tablet Take 1 tablet (5 mg total) by mouth daily. 12/14/22  Yes Fredia Sorrow, MD  pantoprazole (PROTONIX) 20 MG tablet Take 1 tablet (20 mg total) by mouth daily. 10/27/11 10/26/12  Ignacia Felling, PA-C      Allergies    Ibuprofen    Review of Systems   Review of Systems  HENT:  Positive for congestion.   Respiratory:  Positive for cough.   Gastrointestinal:  Positive for nausea and vomiting.  Neurological:  Positive for syncope and numbness.    Physical Exam Updated Vital Signs BP (!) 181/117   Pulse 89   Temp 97.9 F (36.6 C)   Resp 18   Ht 1.702 m ('5\' 7"'$ )   Wt 81.6 kg   SpO2 99%   BMI 28.19 kg/m  Physical Exam Vitals and nursing note reviewed.   Constitutional:      General: He is not in acute distress.    Appearance: Normal appearance. He is well-developed.  HENT:     Head: Normocephalic and atraumatic.  Eyes:     Extraocular Movements: Extraocular movements intact.     Conjunctiva/sclera: Conjunctivae normal.     Pupils: Pupils are equal, round, and reactive to light.  Cardiovascular:     Rate and Rhythm: Normal rate and regular rhythm.     Heart sounds: No murmur heard. Pulmonary:     Effort: Pulmonary effort is normal. No respiratory distress.     Breath sounds: Normal breath sounds.  Abdominal:     Palpations: Abdomen is soft.     Tenderness: There is no abdominal tenderness.  Musculoskeletal:        General: No swelling. Normal range of motion.     Cervical back: Normal range of motion and neck supple.     Right lower leg: No edema.     Left lower leg: No edema.  Skin:    General: Skin is warm and dry.     Capillary Refill: Capillary refill takes less than 2 seconds.  Neurological:     General: No focal deficit present.     Mental Status: He is alert and oriented to person, place, and time.     Cranial  Nerves: No cranial nerve deficit.     Sensory: No sensory deficit.     Motor: No weakness.  Psychiatric:        Mood and Affect: Mood normal.     ED Results / Procedures / Treatments   Labs (all labs ordered are listed, but only abnormal results are displayed) Labs Reviewed  BASIC METABOLIC PANEL - Abnormal; Notable for the following components:      Result Value   Potassium 3.3 (*)    CO2 21 (*)    Anion gap 19 (*)    All other components within normal limits  RESP PANEL BY RT-PCR (RSV, FLU A&B, COVID)  [id number]  CBC  TROPONIN I (HIGH SENSITIVITY)  TROPONIN I (HIGH SENSITIVITY)    EKG EKG Interpretation  Date/Time:  Friday December 14 2022 14:30:37 EST Ventricular Rate:  92 PR Interval:    QRS Duration: 86 QT Interval:  354 QTC Calculation: 437 R Axis:   14 Text Interpretation: Sinus rhythm  When compared with ECG of 14-Dec-2022 01:11, T wave inversion now evident in Inferior leads T wave inversion now evident in Anterior leads Confirmed by Octaviano Glow [phone]) on 12/14/2022 2:42:23 PM  Radiology DG Chest 2 View  Result Date: 12/14/2022 CLINICAL DATA:  Chest pain, hypertension nothing the fracture day medicine EXAM: CHEST - 2 VIEW COMPARISON:  None Available. FINDINGS: The heart size and mediastinal contours are within normal limits. Both lungs are clear. Prominent S shaped scoliosis of the thoracolumbar spine. IMPRESSION: No active cardiopulmonary disease. Electronically Signed   By: Keane Police D.O.   On: 12/14/2022 15:08    Procedures Procedures    Medications Ordered in ED Medications  0.9 %  sodium chloride infusion ( Intravenous New Bag/Given 12/14/22 1642)  sodium chloride 0.9 % bolus 1,000 mL (0 mLs Intravenous Stopped 12/14/22 1644)    ED Course/ Medical Decision Making/ A&P                             Medical Decision Making Risk Prescription drug management.  EKG here with some subtle changes.  The patient's troponins x 2 are normal.  Respiratory panel negative for COVID flu and RSV.  Base metabolic panel was significant for potassium of 3.3, renal function normal.  CBC no leukocytosis hemoglobin 14.5.  Patient's blood pressure upon arrival here was 191/26 oxygen saturations on room air was 100%.  Patient's blood pressure is now down to 0000000 systolic.  Probably does have hypertension.  Will start him on amlodipine given referral to wellness clinic until he can find a primary care doctor.  Treat him with Zofran for the nausea and vomiting.  Patient's chest x-ray also negative for any pneumonia or any acute findings.  Final Clinical Impression(s) / ED Diagnoses Final diagnoses:  Primary hypertension  Atypical chest pain  Influenza-like illness    Rx / DC Orders ED Discharge Orders          Ordered    amLODipine (NORVASC) 5 MG tablet  Daily        12/14/22 1748               Fredia Sorrow, MD 12/14/22 1757
